# Patient Record
Sex: Female | Born: 1965 | Race: White | Hispanic: No | Marital: Married | Smoking: Former smoker
Health system: Southern US, Community
[De-identification: ages and names within clinical notes are randomized; demographics above are authoritative.]

## PROBLEM LIST (undated history)

## (undated) DIAGNOSIS — F329 Major depressive disorder, single episode, unspecified: Secondary | ICD-10-CM

## (undated) DIAGNOSIS — F419 Anxiety disorder, unspecified: Secondary | ICD-10-CM

## (undated) DIAGNOSIS — F32A Depression, unspecified: Secondary | ICD-10-CM

## (undated) DIAGNOSIS — R51 Headache: Secondary | ICD-10-CM

## (undated) DIAGNOSIS — Z8489 Family history of other specified conditions: Secondary | ICD-10-CM

## (undated) DIAGNOSIS — E039 Hypothyroidism, unspecified: Secondary | ICD-10-CM

## (undated) DIAGNOSIS — R519 Headache, unspecified: Secondary | ICD-10-CM

## (undated) HISTORY — PX: CERVICAL BIOPSY  W/ LOOP ELECTRODE EXCISION: SUR135

## (undated) HISTORY — PX: DILATION AND CURETTAGE OF UTERUS: SHX78

## (undated) HISTORY — PX: TUBAL LIGATION: SHX77

---

## 2006-10-18 ENCOUNTER — Ambulatory Visit: Payer: Self-pay | Admitting: Family Medicine

## 2013-12-31 ENCOUNTER — Ambulatory Visit: Payer: Self-pay | Admitting: Family Medicine

## 2014-01-09 ENCOUNTER — Ambulatory Visit: Payer: Self-pay | Admitting: Family Medicine

## 2015-08-03 ENCOUNTER — Other Ambulatory Visit: Payer: Self-pay | Admitting: Family Medicine

## 2015-08-03 ENCOUNTER — Ambulatory Visit
Admission: RE | Admit: 2015-08-03 | Discharge: 2015-08-03 | Disposition: A | Discharge status: Home / Self Care | Payer: BLUE CROSS/BLUE SHIELD | Source: Ambulatory Visit | Attending: Family Medicine | Admitting: Family Medicine

## 2015-08-03 DIAGNOSIS — R1011 Right upper quadrant pain: Secondary | ICD-10-CM | POA: Insufficient documentation

## 2015-08-05 ENCOUNTER — Other Ambulatory Visit: Payer: Self-pay | Admitting: Student

## 2015-08-05 DIAGNOSIS — R1011 Right upper quadrant pain: Secondary | ICD-10-CM

## 2015-08-18 ENCOUNTER — Encounter
Admission: RE | Admit: 2015-08-18 | Discharge: 2015-08-18 | Disposition: A | Discharge status: Home / Self Care | Payer: BLUE CROSS/BLUE SHIELD | Source: Ambulatory Visit | Attending: Student | Admitting: Student

## 2015-08-18 DIAGNOSIS — R1011 Right upper quadrant pain: Secondary | ICD-10-CM | POA: Diagnosis present

## 2015-08-18 MED ORDER — SINCALIDE 5 MCG IJ SOLR
0.0200 ug/kg | Freq: Once | INTRAMUSCULAR | Status: AC
  Administered 2015-08-18: 1.43 ug via INTRAVENOUS

## 2015-08-18 MED ORDER — TECHNETIUM TC 99M MEBROFENIN IV KIT
5.2000 | PACK | Freq: Once | INTRAVENOUS | Status: DC | PRN
  Administered 2015-08-18: 5.2 via INTRAVENOUS
  Filled 2015-08-18: qty 6

## 2015-09-08 ENCOUNTER — Encounter: Payer: Self-pay | Admitting: *Deleted

## 2015-09-08 ENCOUNTER — Other Ambulatory Visit: Payer: BLUE CROSS/BLUE SHIELD

## 2015-09-08 NOTE — Patient Instructions (Signed)
  Your procedure is scheduled on: 09-17-15  Report to [facility] 2ND FLOOR To find out your arrival time please call [phone] between 1PM - 3PM on 09-16-15  Remember: Instructions that are not followed completely may result in serious medical risk, up to and including death, or upon the discretion of your surgeon and anesthesiologist your surgery may need to be rescheduled.    _X___ 1. Do not eat food or drink liquids after midnight. No gum chewing or hard candies.     _X___ 2. No Alcohol for 24 hours before or after surgery.   ____ 3. Bring all medications with you on the day of surgery if instructed.    ____ 4. Notify your doctor if there is any change in your medical condition     (cold, fever, infections).     Do not wear jewelry, make-up, hairpins, clips or nail polish.  Do not wear lotions, powders, or perfumes. You may wear deodorant.  Do not shave 48 hours prior to surgery. Men may shave face and neck.  Do not bring valuables to the hospital.    [facility] is not responsible for any belongings or valuables.               Contacts, dentures or bridgework may not be worn into surgery.  Leave your suitcase in the car. After surgery it may be brought to your room.  For patients admitted to the hospital, discharge time is determined by your  treatment team.   Patients discharged the day of surgery will not be allowed to drive home.   Please read over the following fact sheets that you were given:     _X___ Take these medicines the morning of surgery with A SIP OF WATER:    1. BUPROPION [facility](WELLBUTRIN)  2. LEVOTHYROXINE  3.   4.  5.  6.  ____ Fleet Enema (as directed)   ____ Use CHG Soap as directed  ____ Use inhalers on the day of surgery  ____ Stop metformin 2 days prior to surgery    ____ Take 1/2 of usual insulin dose the night before surgery and none on the morning of surgery.   ____ Stop Coumadin/Plavix/aspirin-N/A  ____ Stop  Anti-inflammatories-NO NSAIDS OR ASA PRODUCTS 7 DAYS PRIOR TO SURGERY-TYLENOL OK   ____ Stop supplements until after surgery.    ____ Bring C-Pap to the hospital.

## 2015-09-17 ENCOUNTER — Encounter
Admission: RE | Disposition: A | Discharge status: Home / Self Care | Payer: Self-pay | Source: Ambulatory Visit | Attending: Surgery

## 2015-09-17 ENCOUNTER — Ambulatory Visit: Payer: BLUE CROSS/BLUE SHIELD | Admitting: Anesthesiology

## 2015-09-17 ENCOUNTER — Ambulatory Visit
Admission: RE | Admit: 2015-09-17 | Discharge: 2015-09-17 | Disposition: A | Discharge status: Home / Self Care | Payer: BLUE CROSS/BLUE SHIELD | Source: Ambulatory Visit | Attending: Surgery | Admitting: Surgery

## 2015-09-17 ENCOUNTER — Ambulatory Visit: Payer: BLUE CROSS/BLUE SHIELD

## 2015-09-17 ENCOUNTER — Encounter: Payer: Self-pay | Admitting: Anesthesiology

## 2015-09-17 DIAGNOSIS — K811 Chronic cholecystitis: Secondary | ICD-10-CM | POA: Diagnosis not present

## 2015-09-17 DIAGNOSIS — Z87891 Personal history of nicotine dependence: Secondary | ICD-10-CM | POA: Diagnosis not present

## 2015-09-17 DIAGNOSIS — K5900 Constipation, unspecified: Secondary | ICD-10-CM | POA: Diagnosis not present

## 2015-09-17 DIAGNOSIS — R1013 Epigastric pain: Secondary | ICD-10-CM | POA: Diagnosis not present

## 2015-09-17 DIAGNOSIS — Z79899 Other long term (current) drug therapy: Secondary | ICD-10-CM | POA: Diagnosis not present

## 2015-09-17 DIAGNOSIS — R1011 Right upper quadrant pain: Secondary | ICD-10-CM | POA: Diagnosis present

## 2015-09-17 DIAGNOSIS — E039 Hypothyroidism, unspecified: Secondary | ICD-10-CM | POA: Insufficient documentation

## 2015-09-17 DIAGNOSIS — F329 Major depressive disorder, single episode, unspecified: Secondary | ICD-10-CM | POA: Diagnosis not present

## 2015-09-17 DIAGNOSIS — G43909 Migraine, unspecified, not intractable, without status migrainosus: Secondary | ICD-10-CM | POA: Diagnosis not present

## 2015-09-17 DIAGNOSIS — Z881 Allergy status to other antibiotic agents status: Secondary | ICD-10-CM | POA: Insufficient documentation

## 2015-09-17 DIAGNOSIS — Z818 Family history of other mental and behavioral disorders: Secondary | ICD-10-CM | POA: Insufficient documentation

## 2015-09-17 DIAGNOSIS — Z419 Encounter for procedure for purposes other than remedying health state, unspecified: Secondary | ICD-10-CM

## 2015-09-17 DIAGNOSIS — Z8379 Family history of other diseases of the digestive system: Secondary | ICD-10-CM | POA: Insufficient documentation

## 2015-09-17 DIAGNOSIS — Z8262 Family history of osteoporosis: Secondary | ICD-10-CM | POA: Diagnosis not present

## 2015-09-17 HISTORY — DX: Major depressive disorder, single episode, unspecified: F32.9

## 2015-09-17 HISTORY — PX: CHOLECYSTECTOMY: SHX55

## 2015-09-17 HISTORY — DX: Headache, unspecified: R51.9

## 2015-09-17 HISTORY — DX: Depression, unspecified: F32.A

## 2015-09-17 HISTORY — DX: Headache: R51

## 2015-09-17 HISTORY — DX: Hypothyroidism, unspecified: E03.9

## 2015-09-17 HISTORY — DX: Family history of other specified conditions: Z84.89

## 2015-09-17 LAB — POCT PREGNANCY, URINE: PREG TEST UR: NEGATIVE

## 2015-09-17 SURGERY — LAPAROSCOPIC CHOLECYSTECTOMY
Anesthesia: General

## 2015-09-17 MED ORDER — DEXAMETHASONE SODIUM PHOSPHATE 10 MG/ML IJ SOLN
INTRAMUSCULAR | Status: DC | PRN
  Administered 2015-09-17: 5 mg via INTRAVENOUS

## 2015-09-17 MED ORDER — LIDOCAINE HCL (CARDIAC) 20 MG/ML IV SOLN
INTRAVENOUS | Status: DC | PRN
  Administered 2015-09-17: 50 mg via INTRAVENOUS

## 2015-09-17 MED ORDER — SODIUM CHLORIDE 0.9 % IV SOLN
INTRAVENOUS | Status: DC | PRN
  Administered 2015-09-17: 1 mL via INTRAMUSCULAR

## 2015-09-17 MED ORDER — ROCURONIUM BROMIDE 100 MG/10ML IV SOLN
INTRAVENOUS | Status: DC | PRN
  Administered 2015-09-17: 10 mg via INTRAVENOUS
  Administered 2015-09-17: 25 mg via INTRAVENOUS
  Administered 2015-09-17: 5 mg via INTRAVENOUS

## 2015-09-17 MED ORDER — GLYCOPYRROLATE 0.2 MG/ML IJ SOLN
INTRAMUSCULAR | Status: DC | PRN
  Administered 2015-09-17: 0.6 mg via INTRAVENOUS

## 2015-09-17 MED ORDER — NEOSTIGMINE METHYLSULFATE 10 MG/10ML IV SOLN
INTRAVENOUS | Status: DC | PRN
  Administered 2015-09-17: 3 mg via INTRAVENOUS

## 2015-09-17 MED ORDER — PROPOFOL 10 MG/ML IV BOLUS
INTRAVENOUS | Status: DC | PRN
  Administered 2015-09-17: 160 mg via INTRAVENOUS

## 2015-09-17 MED ORDER — HEPARIN SODIUM (PORCINE) 5000 UNIT/ML IJ SOLN
INTRAMUSCULAR | Status: AC
  Filled 2015-09-17: qty 1

## 2015-09-17 MED ORDER — FENTANYL CITRATE (PF) 100 MCG/2ML IJ SOLN
INTRAMUSCULAR | Status: DC | PRN
  Administered 2015-09-17 (×4): 50 ug via INTRAVENOUS

## 2015-09-17 MED ORDER — LACTATED RINGERS IV SOLN
INTRAVENOUS | Status: DC
  Administered 2015-09-17 (×3): via INTRAVENOUS

## 2015-09-17 MED ORDER — SODIUM CHLORIDE 0.9 % IV SOLN
INTRAVENOUS | Status: DC | PRN
  Administered 2015-09-17: .1 mL

## 2015-09-17 MED ORDER — FAMOTIDINE 20 MG PO TABS
20.0000 mg | ORAL_TABLET | Freq: Once | ORAL | Status: AC
  Administered 2015-09-17: 20 mg via ORAL

## 2015-09-17 MED ORDER — HYDROCODONE-ACETAMINOPHEN 5-325 MG PO TABS
1.0000 | ORAL_TABLET | ORAL | Status: DC | PRN
Start: 2015-09-17 — End: 2015-09-17

## 2015-09-17 MED ORDER — SODIUM CHLORIDE 0.9 % IJ SOLN
INTRAMUSCULAR | Status: AC
  Filled 2015-09-17: qty 50

## 2015-09-17 MED ORDER — FAMOTIDINE 20 MG PO TABS
ORAL_TABLET | ORAL | Status: AC
  Administered 2015-09-17: 20 mg via ORAL
  Filled 2015-09-17: qty 1

## 2015-09-17 MED ORDER — ONDANSETRON HCL 4 MG/2ML IJ SOLN
INTRAMUSCULAR | Status: DC | PRN
  Administered 2015-09-17: 4 mg via INTRAVENOUS

## 2015-09-17 MED ORDER — MIDAZOLAM HCL 2 MG/2ML IJ SOLN
INTRAMUSCULAR | Status: DC | PRN
  Administered 2015-09-17: 2 mg via INTRAVENOUS

## 2015-09-17 MED ORDER — ONDANSETRON HCL 4 MG/2ML IJ SOLN: 4.0000 mg | Freq: Once | INTRAMUSCULAR | Status: DC | PRN

## 2015-09-17 MED ORDER — FENTANYL CITRATE (PF) 100 MCG/2ML IJ SOLN
INTRAMUSCULAR | Status: AC
  Administered 2015-09-17: 25 ug via INTRAVENOUS
  Filled 2015-09-17: qty 2

## 2015-09-17 MED ORDER — SUCCINYLCHOLINE CHLORIDE 20 MG/ML IJ SOLN
INTRAMUSCULAR | Status: DC | PRN
  Administered 2015-09-17: 160 mg via INTRAVENOUS

## 2015-09-17 MED ORDER — HYDROCODONE-ACETAMINOPHEN 5-325 MG PO TABS: 1.0000 | ORAL_TABLET | ORAL | Status: DC | PRN

## 2015-09-17 MED ORDER — METOCLOPRAMIDE HCL 5 MG/ML IJ SOLN
INTRAMUSCULAR | Status: DC | PRN
  Administered 2015-09-17: 10 mg via INTRAVENOUS

## 2015-09-17 MED ORDER — FENTANYL CITRATE (PF) 100 MCG/2ML IJ SOLN
25.0000 ug | INTRAMUSCULAR | Status: DC | PRN
Start: 2015-09-17 — End: 2015-09-17
  Administered 2015-09-17 (×4): 25 ug via INTRAVENOUS

## 2015-09-17 SURGICAL SUPPLY — 35 items
APPLIER CLIP ROT 10 11.4 M/L (STAPLE) ×2
BENZOIN TINCTURE PRP APPL 2/3 (GAUZE/BANDAGES/DRESSINGS) ×2 IMPLANT
CANISTER SUCT 1200ML W/VALVE (MISCELLANEOUS) ×2 IMPLANT
CANNULA DILATOR 10 W/SLV (CANNULA) ×2 IMPLANT
CATH REDDICK CHOLANGI 4FR 50CM (CATHETERS) ×2 IMPLANT
CHLORAPREP W/TINT 26ML (MISCELLANEOUS) ×2 IMPLANT
CLIP APPLIE ROT 10 11.4 M/L (STAPLE) ×1 IMPLANT
DRAPE SHEET LG 3/4 BI-LAMINATE (DRAPES) ×2 IMPLANT
GAUZE SPONGE 4X4 12PLY STRL (GAUZE/BANDAGES/DRESSINGS) ×2 IMPLANT
GLOVE BIO SURGEON STRL SZ7.5 (GLOVE) ×2 IMPLANT
GOWN STRL REUS W/ TWL LRG LVL3 (GOWN DISPOSABLE) ×4 IMPLANT
GOWN STRL REUS W/TWL LRG LVL3 (GOWN DISPOSABLE) ×4
IRRIGATION STRYKERFLOW (MISCELLANEOUS) ×1 IMPLANT
IRRIGATOR STRYKERFLOW (MISCELLANEOUS) ×2
IV NS 1000ML (IV SOLUTION) ×1
IV NS 1000ML BAXH (IV SOLUTION) ×1 IMPLANT
KIT RM TURNOVER STRD PROC AR (KITS) ×2 IMPLANT
LABEL OR SOLS (LABEL) ×2 IMPLANT
NDL INSUFF ACCESS 14 VERSASTEP (NEEDLE) ×2 IMPLANT
NEEDLE FILTER BLUNT 18X 1/2SAF (NEEDLE) ×1
NEEDLE FILTER BLUNT 18X1 1/2 (NEEDLE) ×1 IMPLANT
NS IRRIG 500ML POUR BTL (IV SOLUTION) ×2 IMPLANT
PACK LAP CHOLECYSTECTOMY (MISCELLANEOUS) ×2 IMPLANT
PAD GROUND ADULT SPLIT (MISCELLANEOUS) ×2 IMPLANT
SCISSORS METZENBAUM CVD 33 (INSTRUMENTS) ×2 IMPLANT
SEAL FOR SCOPE WARMER C3101 (MISCELLANEOUS) ×2 IMPLANT
SLEEVE ENDOPATH XCEL 5M (ENDOMECHANICALS) ×2 IMPLANT
STRIP CLOSURE SKIN 1/2X4 (GAUZE/BANDAGES/DRESSINGS) ×2 IMPLANT
SUT CHROMIC 5 0 RB 1 27 (SUTURE) ×2 IMPLANT
SUT VIC AB 0 CT2 27 (SUTURE) IMPLANT
SYR 3ML LL SCALE MARK (SYRINGE) ×2 IMPLANT
TROCAR XCEL NON-BLD 11X100MML (ENDOMECHANICALS) ×2 IMPLANT
TROCAR XCEL NON-BLD 5MMX100MML (ENDOMECHANICALS) ×2 IMPLANT
TUBING INSUFFLATOR HI FLOW (MISCELLANEOUS) ×2 IMPLANT
WATER STERILE IRR 1000ML POUR (IV SOLUTION) ×2 IMPLANT

## 2015-09-17 NOTE — Anesthesia Preprocedure Evaluation (Signed)
Anesthesia Evaluation  Patient identified by MRN, date of birth, ID band Patient awake    Reviewed: Allergy & Precautions, NPO status , Patient's Chart, lab work & pertinent test results, reviewed documented beta blocker date and time   History of Anesthesia Complications (+) Family history of anesthesia reaction  Airway Mallampati: II  TM Distance: >3 FB     Dental  (+) Chipped   Pulmonary former smoker,           Cardiovascular      Neuro/Psych  Headaches, PSYCHIATRIC DISORDERS Depression    GI/Hepatic   Endo/Other  Hypothyroidism   Renal/GU      Musculoskeletal   Abdominal   Peds  Hematology   Anesthesia Other Findings   Reproductive/Obstetrics                             Anesthesia Physical Anesthesia Plan  ASA: II  Anesthesia Plan: General   Post-op Pain Management:    Induction: Intravenous  Airway Management Planned: Oral ETT  Additional Equipment:   Intra-op Plan:   Post-operative Plan:   Informed Consent: I have reviewed the patients History and Physical, chart, labs and discussed the procedure including the risks, benefits and alternatives for the proposed anesthesia with the patient or authorized representative who has indicated his/her understanding and acceptance.     Plan Discussed with: CRNA  Anesthesia Plan Comments:         Anesthesia Quick Evaluation

## 2015-09-17 NOTE — Discharge Instructions (Addendum)
Take Tylenol or Norco  as needed for pain.   Remove dressings on Friday. May shower Saturday.    Steri-Strips  will  likely stick for about a week.     AMBULATORY SURGERY  DISCHARGE INSTRUCTIONS   1) The drugs that you were given will stay in your system until tomorrow so for the next 24 hours you should not:  A) Drive an automobile B) Make any legal decisions C) Drink any alcoholic beverage   2) You may resume regular meals tomorrow.  Today it is better to start with liquids and gradually work up to solid foods.  You may eat anything you prefer, but it is better to start with liquids, then soup and crackers, and gradually work up to solid foods.   3) Please notify your doctor immediately if you have any unusual bleeding, trouble breathing, redness and pain at the surgery site, drainage, fever, or pain not relieved by medication.    4) Additional Instructions:   Please contact your physician with any problems or Same Day Surgery at [phone], Monday through Friday 6 am to 4 pm, or Delia at [facility] Main number at [phone].

## 2015-09-17 NOTE — Anesthesia Postprocedure Evaluation (Signed)
Anesthesia Post Note  Patient: Karen Sparks  Procedure(s) Performed: Procedure(s) (LRB): LAPAROSCOPIC CHOLECYSTECTOMY (N/A)  Patient location during evaluation: PACU Anesthesia Type: General Level of consciousness: awake Pain management: pain level controlled Vital Signs Assessment: post-procedure vital signs reviewed and stable Respiratory status: spontaneous breathing Cardiovascular status: blood pressure returned to baseline Anesthetic complications: no    Last Vitals:  Filed Vitals:   09/17/15 1207 09/17/15 1245  BP: 138/77 136/78  Pulse: 67 75  Temp: 36.8 C   Resp: 16 16    Last Pain:  Filed Vitals:   09/17/15 1251  PainSc: 3                  Demetrice Amstutz S

## 2015-09-17 NOTE — OR Nursing (Signed)
Dr. Smith visited. 

## 2015-09-17 NOTE — H&P (Signed)
  She reports no change in overall condition since the day of the office visit. She does continue to have epigastric pains.  I discussed the plan for laparoscopic cholecystectomy

## 2015-09-17 NOTE — Transfer of Care (Signed)
Immediate Anesthesia Transfer of Care Note  Patient: Karen Sparks  Procedure(s) Performed: Procedure(s): LAPAROSCOPIC CHOLECYSTECTOMY (N/A)  Patient Location: PACU  Anesthesia Type:General  Level of Consciousness: awake, alert  and oriented  Airway & Oxygen Therapy: Patient Spontanous Breathing and Patient connected to face mask oxygen  Post-op Assessment: Report given to RN and Post -op Vital signs reviewed and stable  Post vital signs: Reviewed and stable  Last Vitals:  Filed Vitals:   09/17/15 0817  BP: 127/89  Pulse: 60  Temp: 36.8 C  Resp: 16    Complications: No apparent anesthesia complications

## 2015-09-17 NOTE — Anesthesia Procedure Notes (Signed)
Procedure Name: Intubation Date/Time: 09/17/2015 9:40 AM Performed by: Tonia GhentOOK-MARTIN, Joeseph Verville Pre-anesthesia Checklist: Patient identified, Emergency Drugs available, Suction available, Patient being monitored and Timeout performed Patient Re-evaluated:Patient Re-evaluated prior to inductionOxygen Delivery Method: Circle system utilized Preoxygenation: Pre-oxygenation with 100% oxygen Intubation Type: IV induction Ventilation: Mask ventilation without difficulty Laryngoscope Size: 3 and Miller Grade View: Grade I Tube type: Oral Tube size: 7.0 mm Number of attempts: 1 Airway Equipment and Method: Stylet Placement Confirmation: ETT inserted through vocal cords under direct vision,  positive ETCO2,  CO2 detector and breath sounds checked- equal and bilateral Secured at: 21 cm Tube secured with: Tape Dental Injury: Teeth and Oropharynx as per pre-operative assessment

## 2015-09-17 NOTE — Op Note (Signed)
OPERATIVE REPORT  PREOPERATIVE DIAGNOSIS:  Chronic cholecystitis   POSTOPERATIVE DIAGNOSIS: Chronic cholecystitis   PROCEDURE: Laparoscopic cholecystectomy with cholangiogram  ANESTHESIA: General  SURGEON: Renda RollsWilton Smith M.D.  INDICATIONS:  She has a history of epigastric pains.  She had a normal gallbladder ejection fraction but did have reproduction of her pain with injection of cholecystokinin.  With the patient on the operating table in the supine position under general endotracheal anesthesia the abdomen was prepared with ChloraPrep solution and draped in a sterile manner. A short incision was made in the inferior aspect of the umbilicus and carried down to the deep fascia which was grasped with a laryngeal hook.  The Veress needle was inserted aspirated and irrigated with saline solution.The peritoneal cavity was insufflated with carbon dioxide.  The Veress needle was removed.  The 10 mm cannula was inserted.  The 10 mm 0 laparoscope was inserted into the peritoneal cavity. .  Initial inspection revealed typical appearance of the liver.  Another incision was made in the epigastrium slightly to the right of the midline to introduce an 11 mm cannula. 2 incisions were made in the lateral aspect of the right upper quadrant to introduce 2   5 mm cannulas. There was an adhesion between the omentum and the falciform ligament which was divided with electric cautery. The gallbladder was retracted towards the right shoulder.  The gallbladder neck was retracted inferiorly and laterally.  The porta hepatis was identified. The gallbladder was mobilized with incision of the visceral peritoneum. The cystic duct was dissected free from surrounding structures. The cystic artery was dissected free from surrounding structures. A critical view of safety was demonstrated  An Endo Clip was placed across the cystic duct adjacent to the gallbladder neck. An incision was made in the cystic duct to introduce a Reddick  catheter. The cholangiogram was done with injection of half-strength Conray 60 dye. This demonstrated the bile ducts and flow of dye into the duodenum. No retained stones were identified. The cholangiogram appeared normal. The Reddick catheter was removed. The cystic duct was doubly ligated with endoclips and divided. The cystic artery was controlled with double endoclips and divided. The gallbladder was dissected free from the liver with use of hook and cautery and blunt dissection. .  There was some bleeding just beneath the visceral peritoneum  of the gallbladder.  Two bleeding points were controlled with Endoclips. Bleeding was minimal and hemostasis was  subsequently intact. The gallbladder was delivered up through the infraumbilical incision opened and suctioned.   and removed.  There   were no grossly palpable stones. The right upper quadrant was inspected, irrigated and aspirated and determined hemostasis was intact.  The cannulas were removed allowing carbon dioxide to escape from the perineal cavity  The skin incisions were closed with interrupted 5-0 chromic subcutaneous suture benzoin and Steri-Strips. Gauze dressings were applied with paper tape.  The patient appeared to be in satisfactory condition and was prepared for transfer to the recovery room  Renda RollsWilton Smith M.D.

## 2015-09-18 ENCOUNTER — Encounter: Payer: Self-pay | Admitting: Surgery

## 2015-09-18 LAB — SURGICAL PATHOLOGY

## 2016-07-22 IMAGING — NM NM HEPATO W/GB/PHARM/[PERSON_NAME]
3 series · 18 of 18 positions shown · non-contrast
Comparison: 01/09/2014

CLINICAL DATA: Right upper quadrant abdominal pain

EXAM:
NUCLEAR MEDICINE HEPATOBILIARY IMAGING WITH GALLBLADDER EF
TECHNIQUE: Sequential images of the abdomen were obtained [DATE] minutes
following intravenous administration of radiopharmaceutical. After
slow intravenous infusion of 1.43 micrograms Cholecystokinin,
gallbladder ejection fraction was determined.
RADIOPHARMACEUTICALS:  5.2 mCi 3c-GGm Choletec IV

[Series 1000: gallbladder ef (results) · 3.90mm/px · 6 of 120 frames shown]
[frame 11/120]
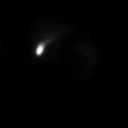
[frame 31/120]
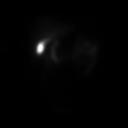
[frame 51/120]
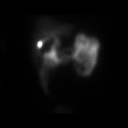
[frame 71/120]
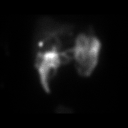
[frame 91/120]
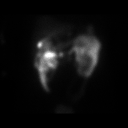
[frame 111/120]
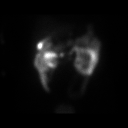

[Series 1000: hepatobiliary scan · 9.59mm/px · 6 of 60 frames shown]
[frame 6/60]
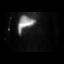
[frame 16/60]
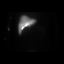
[frame 26/60]
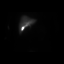
[frame 36/60]
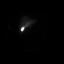
[frame 46/60]
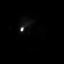
[frame 56/60]
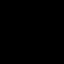

[Series 1000: gallbladder ef · 3.90mm/px · 6 of 120 frames shown]
[frame 11/120]
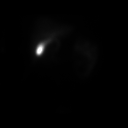
[frame 31/120]
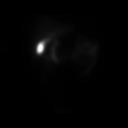
[frame 51/120]
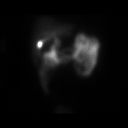
[frame 71/120]
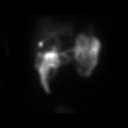
[frame 91/120]
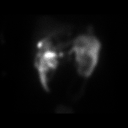
[frame 111/120]
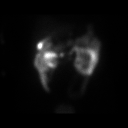

[18 of 18 positions shown; findings below may reference images not displayed]

FINDINGS: Homogeneous and prompt uptake into the liver. The biliary tree and
gallbladder is seen by 20 minutes. Bowel is seen by least 45
minutes. No confounding motion on gallbladder ejection calculation.
Ejection fraction measured 84% at 
50 minutes. At 45 min, normal
ejection fraction is greater than 40%. No indication of pain with
CCK injection.
IMPRESSION: Normal hepatobiliary scan. Normal gallbladder ejection fraction of
84%.

## 2017-10-06 ENCOUNTER — Other Ambulatory Visit: Payer: Self-pay | Admitting: Family Medicine

## 2021-06-29 ENCOUNTER — Emergency Department
Admission: EM | Admit: 2021-06-29 | Discharge: 2021-06-29 | Disposition: A | Discharge status: Home / Self Care | Payer: BLUE CROSS/BLUE SHIELD

## 2021-06-29 NOTE — ED Notes (Signed)
Pt called for the second time. No answer 

## 2021-06-29 NOTE — ED Notes (Signed)
Called pt no answer. Will call again

## 2022-08-02 ENCOUNTER — Other Ambulatory Visit: Payer: Self-pay | Admitting: Sports Medicine

## 2022-08-02 DIAGNOSIS — M5137 Other intervertebral disc degeneration, lumbosacral region: Secondary | ICD-10-CM

## 2022-08-02 DIAGNOSIS — M4727 Other spondylosis with radiculopathy, lumbosacral region: Secondary | ICD-10-CM

## 2022-08-02 DIAGNOSIS — G8929 Other chronic pain: Secondary | ICD-10-CM

## 2022-08-09 ENCOUNTER — Ambulatory Visit: Payer: BC Managed Care – PPO

## 2022-08-16 ENCOUNTER — Ambulatory Visit
Admission: RE | Admit: 2022-08-16 | Discharge: 2022-08-16 | Disposition: A | Discharge status: Home / Self Care | Payer: BC Managed Care – PPO | Source: Ambulatory Visit | Attending: Sports Medicine | Admitting: Sports Medicine

## 2022-08-16 DIAGNOSIS — G8929 Other chronic pain: Secondary | ICD-10-CM | POA: Diagnosis present

## 2022-08-16 DIAGNOSIS — M4727 Other spondylosis with radiculopathy, lumbosacral region: Secondary | ICD-10-CM | POA: Insufficient documentation

## 2022-08-16 DIAGNOSIS — M5442 Lumbago with sciatica, left side: Secondary | ICD-10-CM | POA: Diagnosis not present

## 2022-08-16 DIAGNOSIS — M5137 Other intervertebral disc degeneration, lumbosacral region: Secondary | ICD-10-CM | POA: Insufficient documentation

## 2022-09-05 ENCOUNTER — Other Ambulatory Visit: Payer: Self-pay | Admitting: Neurological Surgery

## 2022-09-28 ENCOUNTER — Encounter (HOSPITAL_COMMUNITY)
Admission: RE | Admit: 2022-09-28 | Discharge: 2022-09-28 | Disposition: A | Discharge status: Home / Self Care | Payer: BC Managed Care – PPO | Source: Ambulatory Visit | Attending: Neurological Surgery | Admitting: Neurological Surgery

## 2022-09-28 ENCOUNTER — Encounter (HOSPITAL_COMMUNITY): Payer: Self-pay | Admitting: *Deleted

## 2022-09-28 ENCOUNTER — Other Ambulatory Visit: Payer: Self-pay

## 2022-09-28 VITALS — BP 147/85 | HR 81 | Temp 98.0°F | Resp 18 | Ht 62.0 in | Wt 185.2 lb

## 2022-09-28 DIAGNOSIS — Z01812 Encounter for preprocedural laboratory examination: Secondary | ICD-10-CM | POA: Diagnosis not present

## 2022-09-28 DIAGNOSIS — Z01818 Encounter for other preprocedural examination: Secondary | ICD-10-CM

## 2022-09-28 HISTORY — DX: Anxiety disorder, unspecified: F41.9

## 2022-09-28 LAB — CBC
HCT: 46.8 % — ABNORMAL HIGH (ref 36.0–46.0)
Hemoglobin: 15.1 g/dL — ABNORMAL HIGH (ref 12.0–15.0)
MCH: 32.4 pg (ref 26.0–34.0)
MCHC: 32.3 g/dL (ref 30.0–36.0)
MCV: 100.4 fL — ABNORMAL HIGH (ref 80.0–100.0)
Platelets: 328 10*3/uL (ref 150–400)
RBC: 4.66 MIL/uL (ref 3.87–5.11)
RDW: 12.6 % (ref 11.5–15.5)
WBC: 9.7 10*3/uL (ref 4.0–10.5)
nRBC: 0 % (ref 0.0–0.2)

## 2022-09-28 LAB — TYPE AND SCREEN
ABO/RH(D): B POS
Antibody Screen: NEGATIVE

## 2022-09-28 LAB — SURGICAL PCR SCREEN
MRSA, PCR: NEGATIVE
Staphylococcus aureus: NEGATIVE

## 2022-09-28 NOTE — Progress Notes (Signed)
PCP - [place] Pink MD Cardiologist - denies  PPM/ICD - denies Device Orders -  Rep Notified -   Chest x-ray - na EKG - na Stress Test - denies ECHO - denies Cardiac Cath - denies  Sleep Study - August 2023 CPAP - pt had worn CPAP every night until approximately 3 weeks ago when her back pain made it intolerable.   Fasting Blood Sugar - na Checks Blood Sugar _____ times a day  Last dose of GLP1 agonist-  na GLP1 instructions:   Blood Thinner Instructions:na Aspirin Instructions:na  ERAS Protcol -no PRE-SURGERY Ensure or G2-   COVID TEST- na   Anesthesia review: no  Patient denies shortness of breath, fever, cough and chest pain at PAT appointment   All instructions explained to the patient, with a verbal understanding of the material. Patient agrees to go over the instructions while at home for a better understanding. Patient also instructed to wear a mask when out in public prior to surgery. The opportunity to ask questions was provided.

## 2022-09-28 NOTE — Progress Notes (Signed)
Surgical Instructions    Your procedure is scheduled on Tuesday January 9.  Report to [facility] Main Entrance "A" at 11:23 A.M., then check in with the Admitting office.  Call this number if you have problems the morning of surgery:  [phone]   If you have any questions prior to your surgery date call [phone]: Open Monday-Friday 8am-4pm If you experience any cold or flu symptoms such as cough, fever, chills, shortness of breath, etc. between now and your scheduled surgery, please notify us at the above number     Remember:  Do not eat or drink anything after midnight the night before your surgery    Take these medicines the morning of surgery with A SIP OF WATER:  buPROPion (WELLBUTRIN SR)  levothyroxine (SYNTHROID)  LYRICA  sertraline (ZOLOFT)   As of today, STOP taking any Aspirin (unless otherwise instructed by your surgeon) Aleve, Naproxen, Ibuprofen, Motrin, Advil, Goody's, BC's, all herbal medications, fish oil, and all vitamins.           Do not wear jewelry or makeup. Do not wear lotions, powders, perfumes/cologne or deodorant. Do not shave 48 hours prior to surgery.  Men may shave face and neck. Do not bring valuables to the hospital. Do not wear nail polish, gel polish, artificial nails, or any other type of covering on natural nails (fingers and toes) If you have artificial nails or gel coating that need to be removed by a nail salon, please have this removed prior to surgery. Artificial nails or gel coating may interfere with anesthesia's ability to adequately monitor your vital signs.  [facility] is not responsible for any belongings or valuables.    Do NOT Smoke (Tobacco/Vaping)  24 hours prior to your procedure  If you use a CPAP at night, you may bring your mask for your overnight stay.   Contacts, glasses, hearing aids, dentures or partials may not be worn into surgery, please bring cases for these belongings   For patients admitted to the hospital,  discharge time will be determined by your treatment team.   Patients discharged the day of surgery will not be allowed to drive home, and someone needs to stay with them for 24 hours.   SURGICAL WAITING ROOM VISITATION Patients having surgery or a procedure may have no more than 2 support people in the waiting area - these visitors may rotate.   Children under the age of 30 must have an adult with them who is not the patient. If the patient needs to stay at the hospital during part of their recovery, the visitor guidelines for inpatient rooms apply. Pre-op nurse will coordinate an appropriate time for 1 support person to accompany patient in pre-op.  This support person may not rotate.   Please refer to [email or web address] for the visitor guidelines for Inpatients (after your surgery is over and you are in a regular room).    Special instructions:    Oral Hygiene is also important to reduce your risk of infection.  Remember - BRUSH YOUR TEETH THE MORNING OF SURGERY WITH YOUR REGULAR TOOTHPASTE   [facility]- Preparing For Surgery  Before surgery, you can play an important role. Because skin is not sterile, your skin needs to be as free of germs as possible. You can reduce the number of germs on your skin by washing with CHG (chlorahexidine gluconate) Soap before surgery.  CHG is an antiseptic cleaner which kills germs and bonds with the skin to continue killing germs even  after washing.     Please do not use if you have an allergy to CHG or antibacterial soaps. If your skin becomes reddened/irritated stop using the CHG.  Do not shave (including legs and underarms) for at least 48 hours prior to first CHG shower. It is OK to shave your face.  Please follow these instructions carefully.     Shower the NIGHT BEFORE SURGERY and the MORNING OF SURGERY with CHG Soap.   If you chose to wash your hair, wash your hair first as usual with your  normal shampoo. After you shampoo, rinse your hair and body thoroughly to remove the shampoo.  Then [employer] and genitals (private parts) with your normal soap and rinse thoroughly to remove soap.  After that Use CHG Soap as you would any other liquid soap. You can apply CHG directly to the skin and wash gently with a scrungie or a clean washcloth.   Apply the CHG Soap to your body ONLY FROM THE NECK DOWN.  Do not use on open wounds or open sores. Avoid contact with your eyes, ears, mouth and genitals (private parts). Wash Face and genitals (private parts)  with your normal soap.   Wash thoroughly, paying special attention to the area where your surgery will be performed.  Thoroughly rinse your body with warm water from the neck down.  DO NOT shower/wash with your normal soap after using and rinsing off the CHG Soap.  Pat yourself dry with a CLEAN TOWEL.  Wear CLEAN PAJAMAS to bed the night before surgery  Place CLEAN SHEETS on your bed the night before your surgery  DO NOT SLEEP WITH PETS.   Day of Surgery:  Take a shower with CHG soap. Wear Clean/Comfortable clothing the morning of surgery Do not apply any deodorants/lotions.   Remember to brush your teeth WITH YOUR REGULAR TOOTHPASTE.    If you received a COVID test during your pre-op visit, it is requested that you wear a mask when out in public, stay away from anyone that may not be feeling well, and notify your surgeon if you develop symptoms. If you have been in contact with anyone that has tested positive in the last 10 days, please notify your surgeon.    Please read over the following fact sheets that you were given.

## 2022-10-04 ENCOUNTER — Observation Stay (HOSPITAL_COMMUNITY)
Admission: RE | Admit: 2022-10-04 | Discharge: 2022-10-05 | Disposition: A | Discharge status: Home / Self Care | Payer: BC Managed Care – PPO | Source: Ambulatory Visit | Attending: Neurological Surgery | Admitting: Neurological Surgery

## 2022-10-04 ENCOUNTER — Ambulatory Visit (HOSPITAL_COMMUNITY): Payer: BC Managed Care – PPO | Admitting: Anesthesiology

## 2022-10-04 ENCOUNTER — Ambulatory Visit (HOSPITAL_COMMUNITY): Payer: BC Managed Care – PPO

## 2022-10-04 ENCOUNTER — Encounter (HOSPITAL_COMMUNITY): Payer: Self-pay | Admitting: Neurological Surgery

## 2022-10-04 ENCOUNTER — Encounter (HOSPITAL_COMMUNITY)
Admission: RE | Disposition: A | Discharge status: Home / Self Care | Payer: Self-pay | Source: Ambulatory Visit | Attending: Neurological Surgery

## 2022-10-04 ENCOUNTER — Other Ambulatory Visit: Payer: Self-pay

## 2022-10-04 DIAGNOSIS — M5416 Radiculopathy, lumbar region: Principal | ICD-10-CM | POA: Insufficient documentation

## 2022-10-04 DIAGNOSIS — Z87891 Personal history of nicotine dependence: Secondary | ICD-10-CM | POA: Insufficient documentation

## 2022-10-04 DIAGNOSIS — M7138 Other bursal cyst, other site: Secondary | ICD-10-CM | POA: Diagnosis not present

## 2022-10-04 DIAGNOSIS — E039 Hypothyroidism, unspecified: Secondary | ICD-10-CM | POA: Diagnosis not present

## 2022-10-04 DIAGNOSIS — Z79899 Other long term (current) drug therapy: Secondary | ICD-10-CM | POA: Insufficient documentation

## 2022-10-04 HISTORY — PX: TRANSFORAMINAL LUMBAR INTERBODY FUSION W/ MIS 1 LEVEL: SHX6145

## 2022-10-04 LAB — ABO/RH: ABO/RH(D): B POS

## 2022-10-04 SURGERY — MINIMALLY INVASIVE (MIS) TRANSFORAMINAL LUMBAR INTERBODY FUSION (TLIF) 1 LEVEL
Anesthesia: General | Laterality: Left

## 2022-10-04 MED ORDER — OXYCODONE HCL 5 MG PO TABS: 5.0000 mg | ORAL_TABLET | ORAL | Status: DC | PRN

## 2022-10-04 MED ORDER — OXYCODONE HCL 5 MG PO TABS: 5.0000 mg | ORAL_TABLET | Freq: Once | ORAL | Status: DC | PRN

## 2022-10-04 MED ORDER — HYDROMORPHONE HCL 1 MG/ML IJ SOLN
INTRAMUSCULAR | Status: AC
  Filled 2022-10-04: qty 1

## 2022-10-04 MED ORDER — BUPIVACAINE HCL (PF) 0.5 % IJ SOLN
INTRAMUSCULAR | Status: DC | PRN
  Administered 2022-10-04: 4.5 mL

## 2022-10-04 MED ORDER — DEXAMETHASONE SODIUM PHOSPHATE 10 MG/ML IJ SOLN
INTRAMUSCULAR | Status: AC
  Filled 2022-10-04: qty 1

## 2022-10-04 MED ORDER — MIDAZOLAM HCL 5 MG/5ML IJ SOLN
INTRAMUSCULAR | Status: DC | PRN
  Administered 2022-10-04: 2 mg via INTRAVENOUS

## 2022-10-04 MED ORDER — ROCURONIUM BROMIDE 10 MG/ML (PF) SYRINGE
PREFILLED_SYRINGE | INTRAVENOUS | Status: AC
  Filled 2022-10-04: qty 30

## 2022-10-04 MED ORDER — PHENYLEPHRINE 80 MCG/ML (10ML) SYRINGE FOR IV PUSH (FOR BLOOD PRESSURE SUPPORT)
PREFILLED_SYRINGE | INTRAVENOUS | Status: DC | PRN
  Administered 2022-10-04: 40 ug via INTRAVENOUS
  Administered 2022-10-04 (×3): 80 ug via INTRAVENOUS
  Administered 2022-10-04: 160 ug via INTRAVENOUS

## 2022-10-04 MED ORDER — CHLORHEXIDINE GLUCONATE CLOTH 2 % EX PADS: 6.0000 | MEDICATED_PAD | Freq: Once | CUTANEOUS | Status: DC

## 2022-10-04 MED ORDER — OXYCODONE HCL 5 MG PO TABS
10.0000 mg | ORAL_TABLET | ORAL | Status: DC | PRN
  Administered 2022-10-04 – 2022-10-05 (×3): 10 mg via ORAL
  Filled 2022-10-04 (×3): qty 2

## 2022-10-04 MED ORDER — ONDANSETRON HCL 4 MG/2ML IJ SOLN: 4.0000 mg | Freq: Four times a day (QID) | INTRAMUSCULAR | Status: DC | PRN

## 2022-10-04 MED ORDER — FENTANYL CITRATE (PF) 250 MCG/5ML IJ SOLN
INTRAMUSCULAR | Status: DC | PRN
  Administered 2022-10-04 (×5): 50 ug via INTRAVENOUS

## 2022-10-04 MED ORDER — OXYCODONE HCL 5 MG PO TABS
5.0000 mg | ORAL_TABLET | ORAL | Status: DC | PRN
  Administered 2022-10-05: 5 mg via ORAL
  Filled 2022-10-04: qty 1

## 2022-10-04 MED ORDER — PHENYLEPHRINE HCL-NACL 20-0.9 MG/250ML-% IV SOLN
INTRAVENOUS | Status: DC | PRN
  Administered 2022-10-04: 25 ug/min via INTRAVENOUS

## 2022-10-04 MED ORDER — FENTANYL CITRATE (PF) 250 MCG/5ML IJ SOLN
INTRAMUSCULAR | Status: AC
  Filled 2022-10-04: qty 5

## 2022-10-04 MED ORDER — SODIUM CHLORIDE 0.9% FLUSH
3.0000 mL | Freq: Two times a day (BID) | INTRAVENOUS | Status: DC
  Administered 2022-10-04: 3 mL via INTRAVENOUS

## 2022-10-04 MED ORDER — BUPIVACAINE HCL (PF) 0.5 % IJ SOLN
INTRAMUSCULAR | Status: AC
  Filled 2022-10-04: qty 30

## 2022-10-04 MED ORDER — ACETAMINOPHEN 325 MG PO TABS
650.0000 mg | ORAL_TABLET | ORAL | Status: DC | PRN
  Administered 2022-10-04 – 2022-10-05 (×3): 650 mg via ORAL
  Filled 2022-10-04 (×3): qty 2

## 2022-10-04 MED ORDER — PROMETHAZINE HCL 25 MG/ML IJ SOLN: 6.2500 mg | INTRAMUSCULAR | Status: DC | PRN

## 2022-10-04 MED ORDER — THROMBIN 5000 UNITS EX SOLR
CUTANEOUS | Status: AC
  Filled 2022-10-04: qty 5000

## 2022-10-04 MED ORDER — LIDOCAINE 2% (20 MG/ML) 5 ML SYRINGE
INTRAMUSCULAR | Status: AC
  Filled 2022-10-04: qty 10

## 2022-10-04 MED ORDER — MEPERIDINE HCL 25 MG/ML IJ SOLN: 6.2500 mg | INTRAMUSCULAR | Status: DC | PRN

## 2022-10-04 MED ORDER — CEFAZOLIN SODIUM-DEXTROSE 2-4 GM/100ML-% IV SOLN
2.0000 g | Freq: Three times a day (TID) | INTRAVENOUS | Status: AC
  Administered 2022-10-04 – 2022-10-05 (×2): 2 g via INTRAVENOUS
  Filled 2022-10-04 (×2): qty 100

## 2022-10-04 MED ORDER — CHLORHEXIDINE GLUCONATE 0.12 % MT SOLN
15.0000 mL | Freq: Once | OROMUCOSAL | Status: AC
  Administered 2022-10-04: 15 mL via OROMUCOSAL
  Filled 2022-10-04: qty 15

## 2022-10-04 MED ORDER — PROPOFOL 10 MG/ML IV BOLUS
INTRAVENOUS | Status: DC | PRN
  Administered 2022-10-04: 20 mg via INTRAVENOUS
  Administered 2022-10-04: 160 mg via INTRAVENOUS
  Administered 2022-10-04: 20 mg via INTRAVENOUS

## 2022-10-04 MED ORDER — POLYETHYLENE GLYCOL 3350 17 G PO PACK: 17.0000 g | PACK | Freq: Every day | ORAL | Status: DC | PRN

## 2022-10-04 MED ORDER — DEXAMETHASONE SODIUM PHOSPHATE 10 MG/ML IJ SOLN
INTRAMUSCULAR | Status: DC | PRN
  Administered 2022-10-04: 10 mg via INTRAVENOUS

## 2022-10-04 MED ORDER — HYDROMORPHONE HCL 1 MG/ML IJ SOLN: 1.0000 mg | INTRAMUSCULAR | Status: DC | PRN

## 2022-10-04 MED ORDER — ONDANSETRON HCL 4 MG/2ML IJ SOLN
INTRAMUSCULAR | Status: DC | PRN
  Administered 2022-10-04: 4 mg via INTRAVENOUS

## 2022-10-04 MED ORDER — LIDOCAINE-EPINEPHRINE 1 %-1:100000 IJ SOLN
INTRAMUSCULAR | Status: AC
  Filled 2022-10-04: qty 1

## 2022-10-04 MED ORDER — MIDAZOLAM HCL 2 MG/2ML IJ SOLN: 0.5000 mg | Freq: Once | INTRAMUSCULAR | Status: DC | PRN

## 2022-10-04 MED ORDER — CYCLOBENZAPRINE HCL 10 MG PO TABS
ORAL_TABLET | ORAL | Status: AC
  Filled 2022-10-04: qty 1

## 2022-10-04 MED ORDER — THROMBIN 5000 UNITS EX SOLR
OROMUCOSAL | Status: DC | PRN
  Administered 2022-10-04: 5 mL via TOPICAL

## 2022-10-04 MED ORDER — OXYCODONE HCL 5 MG PO TABS: 10.0000 mg | ORAL_TABLET | ORAL | Status: DC | PRN

## 2022-10-04 MED ORDER — PHENOL 1.4 % MT LIQD: 1.0000 | OROMUCOSAL | Status: DC | PRN

## 2022-10-04 MED ORDER — SUGAMMADEX SODIUM 200 MG/2ML IV SOLN
INTRAVENOUS | Status: DC | PRN
  Administered 2022-10-04: 200 mg via INTRAVENOUS

## 2022-10-04 MED ORDER — ROCURONIUM BROMIDE 10 MG/ML (PF) SYRINGE
PREFILLED_SYRINGE | INTRAVENOUS | Status: DC | PRN
  Administered 2022-10-04: 20 mg via INTRAVENOUS
  Administered 2022-10-04: 100 mg via INTRAVENOUS

## 2022-10-04 MED ORDER — OXYCODONE HCL 5 MG/5ML PO SOLN: 5.0000 mg | Freq: Once | ORAL | Status: DC | PRN

## 2022-10-04 MED ORDER — ACETAMINOPHEN 500 MG PO TABS
1000.0000 mg | ORAL_TABLET | Freq: Once | ORAL | Status: AC
  Administered 2022-10-04: 1000 mg via ORAL
  Filled 2022-10-04: qty 2

## 2022-10-04 MED ORDER — PROPOFOL 10 MG/ML IV BOLUS
INTRAVENOUS | Status: AC
  Filled 2022-10-04: qty 20

## 2022-10-04 MED ORDER — LIDOCAINE-EPINEPHRINE 1 %-1:100000 IJ SOLN
INTRAMUSCULAR | Status: DC | PRN
  Administered 2022-10-04: 4.5 mL

## 2022-10-04 MED ORDER — ONDANSETRON HCL 4 MG PO TABS: 4.0000 mg | ORAL_TABLET | Freq: Four times a day (QID) | ORAL | Status: DC | PRN

## 2022-10-04 MED ORDER — ACETAMINOPHEN 650 MG RE SUPP: 650.0000 mg | RECTAL | Status: DC | PRN

## 2022-10-04 MED ORDER — MIDAZOLAM HCL 2 MG/2ML IJ SOLN
INTRAMUSCULAR | Status: AC
  Filled 2022-10-04: qty 2

## 2022-10-04 MED ORDER — PREGABALIN 75 MG PO CAPS
75.0000 mg | ORAL_CAPSULE | Freq: Two times a day (BID) | ORAL | Status: DC
  Administered 2022-10-04 – 2022-10-05 (×2): 75 mg via ORAL
  Filled 2022-10-04 (×2): qty 1

## 2022-10-04 MED ORDER — CYCLOBENZAPRINE HCL 10 MG PO TABS
10.0000 mg | ORAL_TABLET | Freq: Three times a day (TID) | ORAL | Status: DC | PRN
  Administered 2022-10-04 – 2022-10-05 (×2): 10 mg via ORAL
  Filled 2022-10-04 (×2): qty 1

## 2022-10-04 MED ORDER — MENTHOL 3 MG MT LOZG: 1.0000 | LOZENGE | OROMUCOSAL | Status: DC | PRN

## 2022-10-04 MED ORDER — LEVOTHYROXINE SODIUM 112 MCG PO TABS
112.0000 ug | ORAL_TABLET | Freq: Every day | ORAL | Status: DC
  Administered 2022-10-05: 112 ug via ORAL
  Filled 2022-10-04: qty 1

## 2022-10-04 MED ORDER — SUCCINYLCHOLINE CHLORIDE 200 MG/10ML IV SOSY
PREFILLED_SYRINGE | INTRAVENOUS | Status: AC
  Filled 2022-10-04: qty 10

## 2022-10-04 MED ORDER — BUPROPION HCL ER (SR) 150 MG PO TB12
150.0000 mg | ORAL_TABLET | Freq: Every day | ORAL | Status: DC
  Administered 2022-10-05: 150 mg via ORAL
  Filled 2022-10-04: qty 1

## 2022-10-04 MED ORDER — DOCUSATE SODIUM 100 MG PO CAPS
100.0000 mg | ORAL_CAPSULE | Freq: Two times a day (BID) | ORAL | Status: DC
  Administered 2022-10-04 – 2022-10-05 (×2): 100 mg via ORAL
  Filled 2022-10-04 (×2): qty 1

## 2022-10-04 MED ORDER — EPHEDRINE 5 MG/ML INJ
INTRAVENOUS | Status: AC
  Filled 2022-10-04: qty 15

## 2022-10-04 MED ORDER — ORAL CARE MOUTH RINSE: 15.0000 mL | Freq: Once | OROMUCOSAL | Status: AC

## 2022-10-04 MED ORDER — SODIUM CHLORIDE 0.9 % IV SOLN
250.0000 mL | INTRAVENOUS | Status: DC
  Administered 2022-10-04: 250 mL via INTRAVENOUS

## 2022-10-04 MED ORDER — CEFAZOLIN SODIUM-DEXTROSE 2-4 GM/100ML-% IV SOLN
2.0000 g | INTRAVENOUS | Status: AC
  Administered 2022-10-04: 2 g via INTRAVENOUS
  Filled 2022-10-04: qty 100

## 2022-10-04 MED ORDER — LACTATED RINGERS IV SOLN: INTRAVENOUS | Status: DC

## 2022-10-04 MED ORDER — SODIUM CHLORIDE 0.9% FLUSH: 3.0000 mL | INTRAVENOUS | Status: DC | PRN

## 2022-10-04 MED ORDER — PHENYLEPHRINE 80 MCG/ML (10ML) SYRINGE FOR IV PUSH (FOR BLOOD PRESSURE SUPPORT)
PREFILLED_SYRINGE | INTRAVENOUS | Status: AC
  Filled 2022-10-04: qty 30

## 2022-10-04 MED ORDER — ONDANSETRON HCL 4 MG/2ML IJ SOLN
INTRAMUSCULAR | Status: AC
  Filled 2022-10-04: qty 2

## 2022-10-04 MED ORDER — EPHEDRINE SULFATE-NACL 50-0.9 MG/10ML-% IV SOSY
PREFILLED_SYRINGE | INTRAVENOUS | Status: DC | PRN
  Administered 2022-10-04: 5 mg via INTRAVENOUS

## 2022-10-04 MED ORDER — HYDROMORPHONE HCL 1 MG/ML IJ SOLN
0.2500 mg | INTRAMUSCULAR | Status: DC | PRN
  Administered 2022-10-04: 0.25 mg via INTRAVENOUS
  Administered 2022-10-04 (×2): 0.5 mg via INTRAVENOUS

## 2022-10-04 MED ORDER — 0.9 % SODIUM CHLORIDE (POUR BTL) OPTIME
TOPICAL | Status: DC | PRN
  Administered 2022-10-04: 1000 mL

## 2022-10-04 MED ORDER — SERTRALINE HCL 50 MG PO TABS
50.0000 mg | ORAL_TABLET | Freq: Every day | ORAL | Status: DC
  Administered 2022-10-05: 50 mg via ORAL
  Filled 2022-10-04: qty 1

## 2022-10-04 MED ORDER — LIDOCAINE 2% (20 MG/ML) 5 ML SYRINGE
INTRAMUSCULAR | Status: DC | PRN
  Administered 2022-10-04: 100 mg via INTRAVENOUS

## 2022-10-04 SURGICAL SUPPLY — 71 items
BAG COUNTER SPONGE SURGICOUNT (BAG) ×1 IMPLANT
BAND RUBBER #18 3X1/16 STRL (MISCELLANEOUS) ×2 IMPLANT
BASKET BONE COLLECTION (BASKET) ×1 IMPLANT
BLADE CLIPPER SURG (BLADE) IMPLANT
BLADE SURG 11 STRL SS (BLADE) ×1 IMPLANT
BUR 14 MATCH 3 (BUR) IMPLANT
BUR MATCHSTICK NEURO 3.0 LAGG (BURR) IMPLANT
BUR MR8 14CM BALL SYMTRI 5 (BUR) IMPLANT
BUR SURG IBUR 4X12.5 (BURR) ×1 IMPLANT
BURR 14 MATCH 3 (BUR) ×1
BURR MR8 14CM BALL SYMTRI 5 (BUR) ×1
BURR SURG IBUR 4X12.5 (BURR)
CAGE EXP CATALYFT SHORT 9X22.5 (Cage) IMPLANT
CNTNR URN SCR LID CUP LEK RST (MISCELLANEOUS) ×1 IMPLANT
CONT SPEC 4OZ STRL OR WHT (MISCELLANEOUS) ×1
COVER BACK TABLE 60X90IN (DRAPES) ×1 IMPLANT
COVERAGE SUPPORT O-ARM STEALTH (MISCELLANEOUS) ×1 IMPLANT
DERMABOND ADVANCED .7 DNX12 (GAUZE/BANDAGES/DRESSINGS) ×1 IMPLANT
DERMABOND ADVANCED .7 DNX6 (GAUZE/BANDAGES/DRESSINGS) IMPLANT
DRAPE C-ARM 42X72 X-RAY (DRAPES) ×1 IMPLANT
DRAPE C-ARMOR (DRAPES) ×1 IMPLANT
DRAPE LAPAROTOMY 100X72X124 (DRAPES) ×1 IMPLANT
DRAPE MICROSCOPE SLANT 54X150 (MISCELLANEOUS) ×1 IMPLANT
DRAPE SHEET LG 3/4 BI-LAMINATE (DRAPES) ×4 IMPLANT
DRAPE SURG 17X23 STRL (DRAPES) ×2 IMPLANT
ELECT BLADE 6.5 EXT (BLADE) ×1 IMPLANT
ELECT REM PT RETURN 9FT ADLT (ELECTROSURGICAL) ×1
ELECTRODE REM PT RTRN 9FT ADLT (ELECTROSURGICAL) ×1 IMPLANT
EXTENDER TAB GUIDE SV 5.5/6.0 (INSTRUMENTS) IMPLANT
FEE COVERAGE SUPPORT O-ARM (MISCELLANEOUS) ×1 IMPLANT
GAUZE 4X4 16PLY ~~LOC~~+RFID DBL (SPONGE) IMPLANT
GAUZE SPONGE 4X4 12PLY STRL (GAUZE/BANDAGES/DRESSINGS) ×1 IMPLANT
GLOVE BIOGEL PI IND STRL 7.5 (GLOVE) ×1 IMPLANT
GLOVE ECLIPSE 7.5 STRL STRAW (GLOVE) ×1 IMPLANT
GLOVE SURG SS PI 6.5 STRL IVOR (GLOVE) IMPLANT
GLOVE SURG SS PI 7.0 STRL IVOR (GLOVE) IMPLANT
GLOVE SURG SS PI 7.5 STRL IVOR (GLOVE) IMPLANT
GOWN STRL REUS W/ TWL LRG LVL3 (GOWN DISPOSABLE) ×1 IMPLANT
GOWN STRL REUS W/ TWL XL LVL3 (GOWN DISPOSABLE) IMPLANT
GOWN STRL REUS W/TWL 2XL LVL3 (GOWN DISPOSABLE) IMPLANT
GOWN STRL REUS W/TWL LRG LVL3 (GOWN DISPOSABLE) ×6
GOWN STRL REUS W/TWL XL LVL3 (GOWN DISPOSABLE) ×1
HEMOSTAT POWDER KIT SURGIFOAM (HEMOSTASIS) ×1 IMPLANT
KIT BASIN OR (CUSTOM PROCEDURE TRAY) ×1 IMPLANT
KIT POSITION SURG JACKSON T1 (MISCELLANEOUS) ×1 IMPLANT
KIT TURNOVER KIT B (KITS) IMPLANT
MARKER SPHERE PSV REFLC NDI (MISCELLANEOUS) ×5 IMPLANT
NDL HYPO 18GX1.5 BLUNT FILL (NEEDLE) IMPLANT
NDL SPNL 18GX3.5 QUINCKE PK (NEEDLE) IMPLANT
NEEDLE HYPO 18GX1.5 BLUNT FILL (NEEDLE) IMPLANT
NEEDLE HYPO 22GX1.5 SAFETY (NEEDLE) ×1 IMPLANT
NEEDLE SPNL 18GX3.5 QUINCKE PK (NEEDLE) ×1 IMPLANT
NS IRRIG 1000ML POUR BTL (IV SOLUTION) ×1 IMPLANT
PACK LAMINECTOMY NEURO (CUSTOM PROCEDURE TRAY) ×1 IMPLANT
PAD ARMBOARD 7.5X6 YLW CONV (MISCELLANEOUS) ×2 IMPLANT
PIN BONE FIX 100 (PIN) IMPLANT
ROD PERC CCM 5.5X35 (Rod) IMPLANT
SCREW SET 5.5/6.0MM SOLERA (Screw) IMPLANT
SCREW SPINAL IFIX 6.5X45 (Screw) IMPLANT
SPIKE FLUID TRANSFER (MISCELLANEOUS) ×1 IMPLANT
SPONGE T-LAP 4X18 ~~LOC~~+RFID (SPONGE) IMPLANT
SUT MNCRL AB 3-0 PS2 18 (SUTURE) ×1 IMPLANT
SUT VIC AB 0 CT1 18XCR BRD8 (SUTURE) IMPLANT
SUT VIC AB 0 CT1 8-18 (SUTURE) ×1
SUT VIC AB 2-0 CP2 18 (SUTURE) ×1 IMPLANT
SYR 3ML LL SCALE MARK (SYRINGE) IMPLANT
TOWEL GREEN STERILE (TOWEL DISPOSABLE) ×1 IMPLANT
TOWEL GREEN STERILE FF (TOWEL DISPOSABLE) ×1 IMPLANT
TRAY FOLEY MTR SLVR 14FR STAT (SET/KITS/TRAYS/PACK) IMPLANT
TRAY FOLEY MTR SLVR 16FR STAT (SET/KITS/TRAYS/PACK) IMPLANT
WATER STERILE IRR 1000ML POUR (IV SOLUTION) ×1 IMPLANT

## 2022-10-04 NOTE — Op Note (Signed)
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 The patient was found to have a large synovial cyst at L4-5 on the left, I therefore recommended resection and MIS TLIF. This was discussed with the patient as well as risks, benefits, and alternatives and wished to proceed with surgery.   OPERATIVE DETAIL:  The patient was taken to the operating room and placed on the OR table in the prone position. A formal time out was performed with two patient identifiers and confirmed the operative site. Anesthesia was induced by the anesthesia team. The operative site was marked, hair was clipped with surgical clippers, the area was then prepped and draped in a sterile fashion.   A small incision was made over the PSIS and a percutaneous hip pain was placed into the pelvis and connected to a reference array for use with frameless navigation. The field was covered and the O-arm was brought into the field. An intraoperative CT was obtained and transferred to the Stealth station, which was registered to the patient's anatomy using the registration frame. The fit appeared to be acceptable. Stereotactic spinal navigation was utilized throughout the procedure for planning and placement of pedicle screw trajectories. The pedicles were marked and used to create skin incisions bilaterally. Contralateral to the TLIF, percutaneous pedicle screws were  placed at L4 and L5 using stereotactic navigation. These were placed by creating a navigated pilot hole then using a navigated awl-tap, palpated, and the screws were placed. Ipsilateral to the TLIF, this was repeated but the trajectories were created, palpated, tapped, but screws were not placed to keep the screw heads from obscuring visualization during the decompression.  Using stereotactic guidance, a [employer] tube was then docked to the left L4-L5 facet and decompression was performed.  An L4-L5 laminectomy was performed by removing the lamina ipsilaterally followed by ligamentum flavum. The synovial cyst was identified, dissected free, and resected.   A left L4-5 complete facetectomy was then performed and the left L4 nerve root was decompressed along its entire course. The disc space was identified, incised, and a discectomy was performed in the standard fashion. Using navigated instruments, the endplates were prepped, bone graft was packed into the disc space, and an expandable cage ([employer]) was packed with autograft and placed into the disc space using navigation. The tube was removed and hemostasis was obtained during its removal.   Ipsilateral instrumentation was then completed by placing the screws into the previously created trajectories. A rod was sized and introduced on both sides then final tightened. A final xray was used to confirm that the hardware was in good position. Hemostasis was again confirmed for both incisions, they were copiously irrigated, and then closed in layers.    EBL:  161mL   DRAINS: none   SPECIMENS: none   Judith Part, MD 10/04/22 10:08 AM

## 2022-10-04 NOTE — Anesthesia Postprocedure Evaluation (Signed)
Anesthesia Post Note  Patient: Arletha Grippe  Procedure(s) Performed: Left Lumbar four-five minimally invasive synovial cyst resection and Transforaminal Lumbar Interbody Fusion (Left) Application of O-Arm (Left)     Patient location during evaluation: PACU Anesthesia Type: General Level of consciousness: awake Pain management: pain level controlled Vital Signs Assessment: post-procedure vital signs reviewed and stable Respiratory status: spontaneous breathing, nonlabored ventilation and respiratory function stable Cardiovascular status: blood pressure returned to baseline and stable Postop Assessment: no apparent nausea or vomiting Anesthetic complications: no   No notable events documented.  Last Vitals:  Vitals:   10/04/22 1440 10/04/22 1501  BP: 123/83 133/77  Pulse: 91 86  Resp: 14 17  Temp: 36.6 C 36.6 C  SpO2: 94% 96%    Last Pain:  Vitals:   10/04/22 1440  TempSrc:   PainSc: Asleep                 Nilda Simmer

## 2022-10-04 NOTE — Transfer of Care (Signed)
Immediate Anesthesia Transfer of Care Note  Patient: Karen Sparks  Procedure(s) Performed: Left Lumbar four-five minimally invasive synovial cyst resection and Transforaminal Lumbar Interbody Fusion (Left) Application of O-Arm (Left)  Patient Location: PACU  Anesthesia Type:General  Level of Consciousness: awake, alert , and oriented  Airway & Oxygen Therapy: Patient Spontanous Breathing and Patient connected to nasal cannula oxygen  Post-op Assessment: Report given to RN and Post -op Vital signs reviewed and stable  Post vital signs: Reviewed and stable  Last Vitals:  Vitals Value Taken Time  BP 124/72 10/04/22 1343  Temp    Pulse 90 10/04/22 1344  Resp 15 10/04/22 1344  SpO2 93 % 10/04/22 1344  Vitals shown include unvalidated device data.  Last Pain:  Vitals:   10/04/22 0945  TempSrc:   PainSc: 7          Complications: No notable events documented.

## 2022-10-04 NOTE — Anesthesia Preprocedure Evaluation (Addendum)
Anesthesia Evaluation  Patient identified by MRN, date of birth, ID band Patient awake    Reviewed: Allergy & Precautions, NPO status , Patient's Chart, lab work & pertinent test results  History of Anesthesia Complications Negative for: history of anesthetic complications  Airway Mallampati: II  TM Distance: >3 FB Neck ROM: Full    Dental  (+) Dental Advisory Given, Chipped   Pulmonary former smoker   breath sounds clear to auscultation       Cardiovascular negative cardio ROS  Rhythm:Regular Rate:Normal     Neuro/Psych   Anxiety Depression    Chronic back pain    GI/Hepatic negative GI ROS, Neg liver ROS,,,  Endo/Other  Hypothyroidism  BMI 35  Renal/GU negative Renal ROS     Musculoskeletal   Abdominal  (+) + obese  Peds  Hematology negative hematology ROS (+)   Anesthesia Other Findings   Reproductive/Obstetrics                             Anesthesia Physical Anesthesia Plan  ASA: 3  Anesthesia Plan: General   Post-op Pain Management: Tylenol PO (pre-op)*   Induction: Intravenous  PONV Risk Score and Plan: 3 and Ondansetron, Dexamethasone and Treatment may vary due to age or medical condition  Airway Management Planned: Oral ETT  Additional Equipment: None  Intra-op Plan:   Post-operative Plan: Extubation in OR  Informed Consent: I have reviewed the patients History and Physical, chart, labs and discussed the procedure including the risks, benefits and alternatives for the proposed anesthesia with the patient or authorized representative who has indicated his/her understanding and acceptance.     Dental advisory given  Plan Discussed with: CRNA and Surgeon  Anesthesia Plan Comments:        Anesthesia Quick Evaluation

## 2022-10-04 NOTE — Anesthesia Procedure Notes (Signed)
Procedure Name: Intubation Date/Time: 10/04/2022 10:30 AM  Performed by: Wilburn Cornelia, CRNAPre-anesthesia Checklist: Patient identified, Emergency Drugs available, Suction available, Patient being monitored and Timeout performed Patient Re-evaluated:Patient Re-evaluated prior to induction Oxygen Delivery Method: Circle system utilized Preoxygenation: Pre-oxygenation with 100% oxygen Induction Type: IV induction Ventilation: Mask ventilation without difficulty Laryngoscope Size: Mac and 3 Grade View: Grade II Tube type: Oral Tube size: 7.0 mm Number of attempts: 1 Airway Equipment and Method: Stylet Placement Confirmation: ETT inserted through vocal cords under direct vision, CO2 detector, positive ETCO2 and breath sounds checked- equal and bilateral Secured at: 21 cm Tube secured with: Tape Dental Injury: Teeth and Oropharynx as per pre-operative assessment

## 2022-10-04 NOTE — H&P (Signed)
Surgical H&P Update  HPI: 57 y.o. with a history of back and LLE radicular pain. Workup showed a large left synovial cyst. No changes in health since they were last seen. Still having the above and wishes to proceed with surgery.  PMHx:  Past Medical History:  Diagnosis Date   Anxiety    Depression    Family history of adverse reaction to anesthesia    daughter-got sick   Headache    migraines   Hypothyroidism    FamHx: History reviewed. No pertinent family history. SocHx:  reports that she quit smoking about 15 years ago. Her smoking use included cigarettes. She has a 15.00 pack-year smoking history. She has never used smokeless tobacco. She reports current alcohol use. She reports that she does not use drugs.  Physical Exam: Strength 5/5 x4 except left 4/5 DF/KE and SILTx4 except L L5 distribution numbness  Assesment/Plan: 57 y.o. 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 Risks, benefits, and alternatives discussed and the patient would like to continue with surgery.  -OR today -3C post-op  Judith Part, MD 10/04/22 10:07 AM

## 2022-10-05 DIAGNOSIS — M5416 Radiculopathy, lumbar region: Secondary | ICD-10-CM | POA: Diagnosis not present

## 2022-10-05 MED ORDER — CYCLOBENZAPRINE HCL 10 MG PO TABS: 10.0000 mg | ORAL_TABLET | Freq: Three times a day (TID) | ORAL | 0 refills | Status: AC | PRN

## 2022-10-05 MED ORDER — OXYCODONE HCL 5 MG PO TABS: ORAL_TABLET | ORAL | 0 refills | Status: AC

## 2022-10-05 NOTE — Progress Notes (Signed)
Neurosurgery Service Progress Note  Subjective: No acute events overnight. States that LE radiculopathy has completely resolved. Back pain as expected for performed procedure. No complaints.    Objective: Vitals:   10/04/22 2003 10/04/22 2256 10/05/22 0431 10/05/22 0731  BP: (!) 111/57 110/76 105/65 123/60  Pulse: 85 90 74 90  Resp: 20 20 20 18   Temp: 98.4 F (36.9 C) 98.2 F (36.8 C) 97.6 F (36.4 C) 97.9 F (36.6 C)  TempSrc: Oral Oral Oral   SpO2: 93% 91% 92% 94%  Weight:      Height:        Physical Exam: Strength 5/5 x4 and SILTx4, incisions c/d/I   Assessment & Plan: 57 y.o. female s/p Left L4-5 MIS laminectomy, resection of synovial cyst and TLIF, recovering well.  -PT/OT recs  -Discharge home   Norm Parcel, [place] 10/05/22 9:02 AM

## 2022-10-05 NOTE — Evaluation (Signed)
Occupational Therapy Evaluation Patient Details Name: Karen Sparks MRN: [id number] DOB: 31-Dec-1965 Today's Date: 10/05/2022   History of Present Illness Pt is a 57 y.o. female now s/p left L4-5 minimally invasive synovial cyst resection and transforaminal lumbar interbody fusion with application of O-arm on 10/04/22. PMH significant for anxiety, depression, and hypothyroidism.   Clinical Impression   PTA, pt was independent in ADL and IADL; pt lived with her spouse. Upon eval, pt performing UB ADL with set-up to mod I and LB ADL with supervision for safety. Pt educated and demonstrating use of compensatory techniques for UB dressing, LB dressing, grooming, toileting, and toilet transfer within precautions. Recommending discharge home with husband to assist as needed. No OT follow up indicated at this time. OT to sign off. Thank you for this order.      Recommendations for follow up therapy are one component of a multi-disciplinary discharge planning process, led by the attending physician.  Recommendations may be updated based on patient status, additional functional criteria and insurance authorization.   Follow Up Recommendations  No OT follow up     Assistance Recommended at Discharge Intermittent Supervision/Assistance  Patient can return home with the following A little help with walking and/or transfers;A little help with bathing/dressing/bathroom;Help with stairs or ramp for entrance;Assist for transportation;Assistance with cooking/housework    Functional Status Assessment  Patient has had a recent decline in their functional status and demonstrates the ability to make significant improvements in function in a reasonable and predictable amount of time.  Equipment Recommendations  BSC/3in1;Other (comment) (RW)    Recommendations for Other Services       Precautions / Restrictions Precautions Precautions: Back Precaution Booklet Issued: Yes (comment) Precaution Comments: All  precautions reviewed within the context of ADL Required Braces or Orthoses: Spinal Brace Spinal Brace: Lumbar corset (No brace needed orders, but LSO in room (pt rrporting she has been wearing it during OOB mobility), and pt supervision to don) Restrictions Weight Bearing Restrictions: No      Mobility Bed Mobility Overal bed mobility: Needs Assistance Bed Mobility: Rolling, Sidelying to Sit Rolling: Supervision Sidelying to sit: Min guard       General bed mobility comments: for safety; HOB slightly elevated. Reviewing optimal technique    Transfers Overall transfer level: Needs assistance Equipment used: None, Rolling walker (2 wheels) Transfers: Sit to/from Stand Sit to Stand: Supervision           General transfer comment: supervision with or without RW for STS      Balance Overall balance assessment: Mild deficits observed, not formally tested                                         ADL either performed or assessed with clinical judgement   ADL Overall ADL's : Needs assistance/impaired Eating/Feeding: Independent   Grooming: Oral care;Supervision/safety;Standing Grooming Details (indicate cue type and reason): reviewed compensatory techniques Upper Body Bathing: Set up;Sitting   Lower Body Bathing: Supervison/ safety;Sit to/from stand   Upper Body Dressing : Set up;Sitting Upper Body Dressing Details (indicate cue type and reason): supervision and 1 verbal cue for brace application/optimal fit Lower Body Dressing: Supervision/safety;Sit to/from stand Lower Body Dressing Details (indicate cue type and reason): educated regarding compensatory techniques Toilet Transfer: Supervision/safety;BSC/3in1;Rolling walker (2 wheels) Toilet Transfer Details (indicate cue type and reason): supervision for safety   Toileting - Clothing Manipulation Details (  indicate cue type and reason): reviewing compensatory techniques for pericare within  precautions Tub/ Shower Transfer: Tub transfer;Min guard;Ambulation;Rolling walker (2 wheels);BSC/3in1 Tub/Shower Transfer Details (indicate cue type and reason): Min guard A for safety only; no physical assist needed. Functional mobility during ADLs: Supervision/safety;Rolling walker (2 wheels) (for in-room mobility) General ADL Comments: supervision nearing mod I     Vision Ability to See in Adequate Light: 0 Adequate Patient Visual Report: No change from baseline Vision Assessment?: No apparent visual deficits Additional Comments: WFL for tasks assessed     Perception Perception Perception Tested?: No   Praxis Praxis Praxis tested?: Not tested    Pertinent Vitals/Pain Pain Assessment Pain Assessment: Faces Faces Pain Scale: Hurts a little bit Pain Location: operative site Pain Descriptors / Indicators: Operative site guarding, Sore Pain Intervention(s): Limited activity within patient's tolerance, Monitored during session     Hand Dominance     Extremity/Trunk Assessment Upper Extremity Assessment Upper Extremity Assessment: Overall WFL for tasks assessed   Lower Extremity Assessment Lower Extremity Assessment: Defer to PT evaluation   Cervical / Trunk Assessment Cervical / Trunk Assessment: Back Surgery   Communication Communication Communication: No difficulties   Cognition Arousal/Alertness: Awake/alert Behavior During Therapy: WFL for tasks assessed/performed Overall Cognitive Status: Within Functional Limits for tasks assessed                                 General Comments: recalling 3/3 precautions on arrival     General Comments  VSS. Husband present    Exercises     Shoulder Instructions      Home Living Family/patient expects to be discharged to:: Private residence Living Arrangements: Spouse/significant other Available Help at Discharge: Family Type of Home: House Home Access: Stairs to enter Technical brewer of Steps:  3 Entrance Stairs-Rails: None Home Layout: One level     Bathroom Shower/Tub: Tub/shower unit (Pt reports they have ordered walk in shower to be installed within the next few weeks)     Bathroom Accessibility: Yes How Accessible: Accessible via walker [place] - single point          Prior Functioning/Environment Prior Level of Function : Independent/Modified Independent;Working/employed;Driving             Mobility Comments: recent use of cane ADLs Comments: Independent in ADL, IADL, working, and driving. Very recently on short term disability due to inability to sit for long periods and no stand up desk. 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        OT Problem List: Decreased strength;Decreased activity tolerance;Impaired balance (sitting and/or standing);Pain;Decreased knowledge of precautions      OT Treatment/Interventions:      OT Goals(Current goals can be found in the care plan section) Acute Rehab OT Goals Patient Stated Goal: get better OT Goal Formulation: With patient  OT Frequency:      Co-evaluation              AM-PAC OT "6 Clicks" Daily Activity     Outcome Measure Help from another person eating meals?: None Help from another person taking care of personal grooming?: A Little Help from another person toileting, which includes using toliet, bedpan, or urinal?: A Little Help from another person bathing (including washing, rinsing, drying)?: A Little Help from another person to put on and taking off regular upper body clothing?: None Help from another person to put on and taking off  regular lower body clothing?: A Little 6 Click Score: 20   End of Session Equipment Utilized During Treatment: Gait belt;Rolling walker (2 wheels);Back brace Nurse Communication: Mobility status  Activity Tolerance: Patient tolerated treatment well Patient left: in bed;with call bell/phone within reach;with family/visitor present  OT Visit  Diagnosis: Unsteadiness on feet (R26.81);Muscle weakness (generalized) (M62.81);Pain Pain - part of body:  (operative site)                Time: 6712-4580 OT Time Calculation (min): 25 min Charges:  OT General Charges $OT Visit: 1 Visit OT Evaluation $OT Eval Low Complexity: 1 Low OT Treatments $Self Care/Home Management : 8-22 mins  Tyler Deis, OTR/L [facility] Acute Rehabilitation Office: [phone]   Karen Sparks 10/05/2022, 8:25 AM

## 2022-10-05 NOTE — Progress Notes (Signed)
Patient alert and oriented, mae's well, voiding adequate amount of urine, swallowing without difficulty, no c/o pain at time of discharge. Patient discharged home with family. Script and discharged instructions given to patient. Patient and family stated understanding of instructions given. Patient has an appointment with Dr. Ostergard   

## 2022-10-05 NOTE — Discharge Instructions (Signed)
   Call Your Doctor If Any of These Occur Redness, drainage, or swelling at the wound.  Temperature greater than 101 degrees. Severe pain not relieved by pain medication. Incision starts to come apart.  

## 2022-10-05 NOTE — Discharge Summary (Signed)
Discharge Summary  Date of Admission: 10/04/2022  Date of Discharge: 10/05/22  Attending Physician: Emelda Brothers, MD  Hospital Course: Patient was admitted following an uncomplicated left C9-4 MIS laminectomy, resection of synovial cyst and TLIF . They were recovered in PACU and transferred to [facility]. Their preop symptoms were improved, their hospital course was uncomplicated and the patient was discharged home today. They will follow up in clinic with me in clinic in 2 weeks.  Neurologic exam at discharge:  Strength 5/5 x4 and SILTx4, incisions c/d/I   Discharge diagnosis: lumbar radiculopathy   Collene Schlichter, PA-C 10/05/22 9:04 AM

## 2022-10-05 NOTE — Evaluation (Signed)
Physical Therapy Evaluation & Discharge Patient Details Name: Karen Sparks MRN: [id number] DOB: [birth year] Today's Date: 10/05/2022  History of Present Illness  Pt is a 57 y.o. female admitted 10/04/22 for elective L L4-5 MIS TLIF with synovial cyst resection. PMH includes anxiety, depression.   Clinical Impression  Patient evaluated by Physical Therapy with no further acute PT needs identified. PTA, pt typically independent and working, recently on disability and using SPC due to worsening back/LE pain; lives with supportive spouse. Today, pt moving well, mod indep with RW though able to walk some without; pt preference for walker and lumbar corset for added stability and comfort. All education has been completed and the patient has no further questions. Acute PT is signing off. Thank you for this referral.     Recommendations for follow up therapy are one component of a multi-disciplinary discharge planning process, led by the attending physician.  Recommendations may be updated based on patient status, additional functional criteria and insurance authorization.  Follow Up Recommendations No PT follow up      Assistance Recommended at Discharge Set up Supervision/Assistance  Patient can return home with the following  Assistance with cooking/housework;Assist for transportation;Help with stairs or ramp for entrance    Equipment Recommendations Rolling walker (2 wheels);BSC/3in1  Recommendations for Other Services   N/A    Functional Status Assessment       Precautions / Restrictions Precautions Precautions: Back Precaution Booklet Issued: Yes (comment) Precaution Comments: All precautions reviewed within the context of ADL Required Braces or Orthoses: Spinal Brace Spinal Brace: Lumbar corset;Other (comment) Spinal Brace Comments: orders for "no brace needed" - pt with LSO that she prefers to wear for comfort, indep to don/doff Restrictions Weight Bearing Restrictions: No       Mobility  Bed Mobility Overal bed mobility: Independent             General bed mobility comments: able to practice supine<>sit via log roll technique with bed flat    Transfers   Equipment used: None, Rolling walker (2 wheels) Transfers: Sit to/from Stand                  Ambulation/Gait Ambulation/Gait assistance: Modified independent (Device/Increase time) Gait Distance (Feet): 400 Feet Assistive device: Rolling walker (2 wheels) Gait Pattern/deviations: Step-through pattern, Decreased stride length Gait velocity: Decreased     General Gait Details: preference to use RW for added stability, though able to walk a few feet in room without DME. pt mod indep with RW  Stairs Stairs: Yes Stairs assistance: Supervision Stair Management: One rail Left, Step to pattern, Forwards Number of Stairs: 4 General stair comments: ascend/descend 4 steps with single UE rail support, educ on technique with painful LE; husband present and educ on guarding  Wheelchair Mobility    Modified Rankin (Stroke Patients Only)       Balance Overall balance assessment: Needs assistance Sitting-balance support: No upper extremity supported, Feet supported Sitting balance-Leahy Scale: Good     Standing balance support: No upper extremity supported, During functional activity Standing balance-Leahy Scale: Fair                               Pertinent Vitals/Pain Pain Assessment Pain Assessment: Faces Faces Pain Scale: Hurts a little bit Pain Location: operative site Pain Descriptors / Indicators: Operative site guarding, Sore Pain Intervention(s): Monitored during session    Home Living Family/patient expects to be discharged to:: Private  residence Living Arrangements: Spouse/significant other Available Help at Discharge: Family Type of Home: House Home Access: Stairs to enter Entrance Stairs-Rails: None Entrance Stairs-Number of Steps: 3   Home Layout: One  level Home Equipment: [place] - single point Additional Comments: walk-in shower to be installed within next few weeks    Prior Function Prior Level of Function : Independent/Modified Independent;Working/employed;Driving             Mobility Comments: typically independent without DME, though recently using SPC due to painful LLE ADLs Comments: Independent in ADL, IADL, working, and driving. Very recently on short term disability due to inability to sit for long periods and no stand up desk. 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     Hand Dominance        Extremity/Trunk Assessment   Upper Extremity Assessment Upper Extremity Assessment: Overall WFL for tasks assessed    Lower Extremity Assessment Lower Extremity Assessment: Overall WFL for tasks assessed    Cervical / Trunk Assessment Cervical / Trunk Assessment: Back Surgery  Communication   Communication: No difficulties  Cognition Arousal/Alertness: Awake/alert Behavior During Therapy: WFL for tasks assessed/performed Overall Cognitive Status: Within Functional Limits for tasks assessed                                          General Comments General comments (skin integrity, edema, etc.): husband present and supportive; educ re: precautions, positioning, activity recommendations, movement strategies    Exercises     Assessment/Plan    PT Assessment Patient does not need any further PT services  PT Problem List         PT Treatment Interventions      PT Goals (Current goals can be found in the Care Plan section)  Acute Rehab PT Goals PT Goal Formulation: All assessment and education complete, DC therapy    Frequency       Co-evaluation               AM-PAC PT "6 Clicks" Mobility  Outcome Measure Help needed turning from your back to your side while in a flat bed without using bedrails?: None Help needed moving from lying on your back to sitting on the side of a flat  bed without using bedrails?: None Help needed moving to and from a bed to a chair (including a wheelchair)?: None Help needed standing up from a chair using your arms (e.g., wheelchair or bedside chair)?: None Help needed to walk in hospital room?: None Help needed climbing 3-5 steps with a railing? : A Little 6 Click Score: 23    End of Session Equipment Utilized During Treatment: Back brace (pt preference) Activity Tolerance: Patient tolerated treatment well Patient left: in bed;with call bell/phone within reach;with family/visitor present Nurse Communication: Mobility status PT Visit Diagnosis: Other abnormalities of gait and mobility (R26.89)    Time: 0938-1829 PT Time Calculation (min) (ACUTE ONLY): 17 min   Charges:   PT Evaluation $PT Eval Low Complexity: [place], PT, DPT Acute Rehabilitation Services  Personal: [facility] Office: [phone]  Derry Lory 10/05/2022, 9:23 AM

## 2022-10-06 ENCOUNTER — Encounter (HOSPITAL_COMMUNITY): Payer: Self-pay | Admitting: Neurological Surgery
